# Patient Record
Sex: Female | Born: 1950 | Race: White | Hispanic: No | Marital: Married
Health system: Southern US, Community
[De-identification: ages and names within clinical notes are randomized; demographics above are authoritative.]

---

## 1998-08-09 ENCOUNTER — Ambulatory Visit (HOSPITAL_COMMUNITY): Admission: RE | Admit: 1998-08-09 | Discharge: 1998-08-09 | Payer: Self-pay

## 1999-08-18 ENCOUNTER — Encounter: Payer: Self-pay | Admitting: Family Medicine

## 1999-08-18 ENCOUNTER — Ambulatory Visit (HOSPITAL_COMMUNITY): Admission: RE | Admit: 1999-08-18 | Discharge: 1999-08-18 | Payer: Self-pay | Admitting: Family Medicine

## 2000-08-27 ENCOUNTER — Ambulatory Visit (HOSPITAL_COMMUNITY): Admission: RE | Admit: 2000-08-27 | Discharge: 2000-08-27 | Payer: Self-pay | Admitting: Family Medicine

## 2000-08-27 ENCOUNTER — Encounter: Payer: Self-pay | Admitting: Family Medicine

## 2001-09-20 ENCOUNTER — Ambulatory Visit (HOSPITAL_COMMUNITY): Admission: RE | Admit: 2001-09-20 | Discharge: 2001-09-20 | Payer: Self-pay | Admitting: *Deleted

## 2001-09-20 ENCOUNTER — Encounter: Payer: Self-pay | Admitting: Family Medicine

## 2002-10-04 ENCOUNTER — Ambulatory Visit (HOSPITAL_COMMUNITY): Admission: RE | Admit: 2002-10-04 | Discharge: 2002-10-04 | Payer: Self-pay | Admitting: *Deleted

## 2002-10-04 ENCOUNTER — Encounter: Payer: Self-pay | Admitting: Family Medicine

## 2003-10-08 ENCOUNTER — Ambulatory Visit (HOSPITAL_COMMUNITY): Admission: RE | Admit: 2003-10-08 | Discharge: 2003-10-08 | Payer: Self-pay | Admitting: Family Medicine

## 2004-10-15 ENCOUNTER — Ambulatory Visit (HOSPITAL_COMMUNITY): Admission: RE | Admit: 2004-10-15 | Discharge: 2004-10-15 | Payer: Self-pay | Admitting: Family Medicine

## 2005-10-20 ENCOUNTER — Ambulatory Visit (HOSPITAL_COMMUNITY): Admission: RE | Admit: 2005-10-20 | Discharge: 2005-10-20 | Payer: Self-pay | Admitting: Family Medicine

## 2006-11-03 ENCOUNTER — Ambulatory Visit (HOSPITAL_COMMUNITY): Admission: RE | Admit: 2006-11-03 | Discharge: 2006-11-03 | Payer: Self-pay | Admitting: Family Medicine

## 2007-11-15 ENCOUNTER — Ambulatory Visit (HOSPITAL_COMMUNITY): Admission: RE | Admit: 2007-11-15 | Discharge: 2007-11-15 | Payer: Self-pay | Admitting: Family Medicine

## 2008-11-15 ENCOUNTER — Ambulatory Visit (HOSPITAL_COMMUNITY): Admission: RE | Admit: 2008-11-15 | Discharge: 2008-11-15 | Payer: Self-pay | Admitting: Family Medicine

## 2009-11-19 ENCOUNTER — Ambulatory Visit (HOSPITAL_COMMUNITY): Admission: RE | Admit: 2009-11-19 | Discharge: 2009-11-19 | Payer: Self-pay | Admitting: Family Medicine

## 2010-10-28 ENCOUNTER — Other Ambulatory Visit (HOSPITAL_COMMUNITY): Payer: Self-pay | Admitting: Family Medicine

## 2010-10-28 DIAGNOSIS — Z1231 Encounter for screening mammogram for malignant neoplasm of breast: Secondary | ICD-10-CM

## 2010-11-24 ENCOUNTER — Ambulatory Visit (HOSPITAL_COMMUNITY)
Admission: RE | Admit: 2010-11-24 | Discharge: 2010-11-24 | Disposition: A | Discharge status: Home / Self Care | Payer: BC Managed Care – PPO | Source: Ambulatory Visit | Attending: Family Medicine | Admitting: Family Medicine

## 2010-11-24 DIAGNOSIS — Z1231 Encounter for screening mammogram for malignant neoplasm of breast: Secondary | ICD-10-CM | POA: Insufficient documentation

## 2011-11-03 ENCOUNTER — Other Ambulatory Visit (HOSPITAL_COMMUNITY): Payer: Self-pay | Admitting: Family Medicine

## 2011-11-03 DIAGNOSIS — Z1231 Encounter for screening mammogram for malignant neoplasm of breast: Secondary | ICD-10-CM

## 2011-11-25 ENCOUNTER — Ambulatory Visit (HOSPITAL_COMMUNITY)
Admission: RE | Admit: 2011-11-25 | Discharge: 2011-11-25 | Disposition: A | Discharge status: Home / Self Care | Payer: BC Managed Care – PPO | Source: Ambulatory Visit | Attending: Family Medicine | Admitting: Family Medicine

## 2011-11-25 DIAGNOSIS — Z1231 Encounter for screening mammogram for malignant neoplasm of breast: Secondary | ICD-10-CM

## 2012-11-18 ENCOUNTER — Other Ambulatory Visit (HOSPITAL_COMMUNITY): Payer: Self-pay | Admitting: Family Medicine

## 2012-11-18 DIAGNOSIS — Z1231 Encounter for screening mammogram for malignant neoplasm of breast: Secondary | ICD-10-CM

## 2012-11-30 ENCOUNTER — Ambulatory Visit (HOSPITAL_COMMUNITY)
Admission: RE | Admit: 2012-11-30 | Discharge: 2012-11-30 | Disposition: A | Discharge status: Home / Self Care | Payer: BC Managed Care – PPO | Source: Ambulatory Visit | Attending: Family Medicine | Admitting: Family Medicine

## 2012-11-30 DIAGNOSIS — Z1231 Encounter for screening mammogram for malignant neoplasm of breast: Secondary | ICD-10-CM | POA: Insufficient documentation

## 2013-11-06 ENCOUNTER — Other Ambulatory Visit (HOSPITAL_COMMUNITY): Payer: Self-pay | Admitting: Family Medicine

## 2013-11-06 DIAGNOSIS — Z1231 Encounter for screening mammogram for malignant neoplasm of breast: Secondary | ICD-10-CM

## 2013-12-05 ENCOUNTER — Other Ambulatory Visit (HOSPITAL_COMMUNITY): Payer: Self-pay | Admitting: Family Medicine

## 2013-12-05 ENCOUNTER — Ambulatory Visit (HOSPITAL_COMMUNITY)
Admission: RE | Admit: 2013-12-05 | Discharge: 2013-12-05 | Disposition: A | Discharge status: Home / Self Care | Payer: BC Managed Care – PPO | Source: Ambulatory Visit | Attending: Family Medicine | Admitting: Family Medicine

## 2013-12-05 DIAGNOSIS — Z1231 Encounter for screening mammogram for malignant neoplasm of breast: Secondary | ICD-10-CM

## 2019-06-14 ENCOUNTER — Other Ambulatory Visit: Payer: Self-pay | Admitting: Family Medicine

## 2019-06-14 DIAGNOSIS — N6489 Other specified disorders of breast: Secondary | ICD-10-CM

## 2019-06-14 DIAGNOSIS — R928 Other abnormal and inconclusive findings on diagnostic imaging of breast: Secondary | ICD-10-CM | POA: Diagnosis not present

## 2019-06-20 ENCOUNTER — Other Ambulatory Visit: Payer: Self-pay | Admitting: Family Medicine

## 2019-06-20 DIAGNOSIS — N6489 Other specified disorders of breast: Secondary | ICD-10-CM

## 2019-06-21 ENCOUNTER — Ambulatory Visit
Admission: RE | Admit: 2019-06-21 | Discharge: 2019-06-21 | Disposition: A | Discharge status: Home / Self Care | Payer: Medicare PPO | Source: Ambulatory Visit | Attending: Family Medicine | Admitting: Family Medicine

## 2019-06-21 ENCOUNTER — Other Ambulatory Visit: Payer: Self-pay

## 2019-06-21 DIAGNOSIS — N6489 Other specified disorders of breast: Secondary | ICD-10-CM

## 2019-06-21 DIAGNOSIS — C50811 Malignant neoplasm of overlapping sites of right female breast: Secondary | ICD-10-CM | POA: Diagnosis not present

## 2019-06-21 DIAGNOSIS — R928 Other abnormal and inconclusive findings on diagnostic imaging of breast: Secondary | ICD-10-CM | POA: Diagnosis not present

## 2019-06-26 ENCOUNTER — Other Ambulatory Visit: Payer: Self-pay | Admitting: Family Medicine

## 2019-06-26 DIAGNOSIS — N6489 Other specified disorders of breast: Secondary | ICD-10-CM

## 2019-06-29 ENCOUNTER — Ambulatory Visit
Admission: RE | Admit: 2019-06-29 | Discharge: 2019-06-29 | Disposition: A | Discharge status: Home / Self Care | Payer: Medicare PPO | Source: Ambulatory Visit | Attending: Family Medicine | Admitting: Family Medicine

## 2019-06-29 ENCOUNTER — Other Ambulatory Visit: Payer: Self-pay

## 2019-06-29 DIAGNOSIS — N6489 Other specified disorders of breast: Secondary | ICD-10-CM

## 2019-06-29 DIAGNOSIS — Z170 Estrogen receptor positive status [ER+]: Secondary | ICD-10-CM | POA: Diagnosis not present

## 2019-06-29 DIAGNOSIS — R928 Other abnormal and inconclusive findings on diagnostic imaging of breast: Secondary | ICD-10-CM | POA: Diagnosis not present

## 2019-06-29 DIAGNOSIS — C50411 Malignant neoplasm of upper-outer quadrant of right female breast: Secondary | ICD-10-CM | POA: Diagnosis not present

## 2019-07-04 DIAGNOSIS — C50511 Malignant neoplasm of lower-outer quadrant of right female breast: Secondary | ICD-10-CM | POA: Diagnosis not present

## 2019-07-04 DIAGNOSIS — Z01818 Encounter for other preprocedural examination: Secondary | ICD-10-CM | POA: Diagnosis not present

## 2019-07-04 DIAGNOSIS — Z170 Estrogen receptor positive status [ER+]: Secondary | ICD-10-CM | POA: Diagnosis not present

## 2019-07-13 DIAGNOSIS — Z01810 Encounter for preprocedural cardiovascular examination: Secondary | ICD-10-CM | POA: Diagnosis not present

## 2019-07-13 DIAGNOSIS — C50411 Malignant neoplasm of upper-outer quadrant of right female breast: Secondary | ICD-10-CM | POA: Diagnosis not present

## 2019-07-13 DIAGNOSIS — C50511 Malignant neoplasm of lower-outer quadrant of right female breast: Secondary | ICD-10-CM | POA: Diagnosis not present

## 2019-07-13 DIAGNOSIS — I517 Cardiomegaly: Secondary | ICD-10-CM | POA: Diagnosis not present

## 2019-07-13 DIAGNOSIS — C50911 Malignant neoplasm of unspecified site of right female breast: Secondary | ICD-10-CM | POA: Diagnosis not present

## 2019-07-13 DIAGNOSIS — Z170 Estrogen receptor positive status [ER+]: Secondary | ICD-10-CM | POA: Diagnosis not present

## 2019-07-13 DIAGNOSIS — Z01818 Encounter for other preprocedural examination: Secondary | ICD-10-CM | POA: Diagnosis not present

## 2019-07-20 DIAGNOSIS — Z20822 Contact with and (suspected) exposure to covid-19: Secondary | ICD-10-CM | POA: Diagnosis not present

## 2019-07-24 DIAGNOSIS — Z170 Estrogen receptor positive status [ER+]: Secondary | ICD-10-CM | POA: Diagnosis not present

## 2019-07-24 DIAGNOSIS — K219 Gastro-esophageal reflux disease without esophagitis: Secondary | ICD-10-CM | POA: Diagnosis not present

## 2019-07-24 DIAGNOSIS — I10 Essential (primary) hypertension: Secondary | ICD-10-CM | POA: Diagnosis not present

## 2019-07-24 DIAGNOSIS — C50511 Malignant neoplasm of lower-outer quadrant of right female breast: Secondary | ICD-10-CM | POA: Diagnosis not present

## 2019-07-24 DIAGNOSIS — Z803 Family history of malignant neoplasm of breast: Secondary | ICD-10-CM | POA: Diagnosis not present

## 2019-07-24 DIAGNOSIS — C50911 Malignant neoplasm of unspecified site of right female breast: Secondary | ICD-10-CM | POA: Diagnosis not present

## 2019-08-01 DIAGNOSIS — Z01818 Encounter for other preprocedural examination: Secondary | ICD-10-CM | POA: Diagnosis not present

## 2019-08-01 DIAGNOSIS — Z170 Estrogen receptor positive status [ER+]: Secondary | ICD-10-CM | POA: Diagnosis not present

## 2019-08-01 DIAGNOSIS — C50311 Malignant neoplasm of lower-inner quadrant of right female breast: Secondary | ICD-10-CM | POA: Diagnosis not present

## 2019-08-22 DIAGNOSIS — C50311 Malignant neoplasm of lower-inner quadrant of right female breast: Secondary | ICD-10-CM | POA: Diagnosis not present

## 2019-08-22 DIAGNOSIS — C50911 Malignant neoplasm of unspecified site of right female breast: Secondary | ICD-10-CM | POA: Diagnosis not present

## 2019-08-22 DIAGNOSIS — Z170 Estrogen receptor positive status [ER+]: Secondary | ICD-10-CM | POA: Diagnosis not present

## 2019-08-31 DIAGNOSIS — E2839 Other primary ovarian failure: Secondary | ICD-10-CM | POA: Diagnosis not present

## 2019-08-31 DIAGNOSIS — C50911 Malignant neoplasm of unspecified site of right female breast: Secondary | ICD-10-CM | POA: Diagnosis not present

## 2019-08-31 DIAGNOSIS — Z170 Estrogen receptor positive status [ER+]: Secondary | ICD-10-CM | POA: Diagnosis not present

## 2019-08-31 DIAGNOSIS — C50811 Malignant neoplasm of overlapping sites of right female breast: Secondary | ICD-10-CM | POA: Diagnosis not present

## 2019-09-08 DIAGNOSIS — Z170 Estrogen receptor positive status [ER+]: Secondary | ICD-10-CM | POA: Diagnosis not present

## 2019-09-08 DIAGNOSIS — N641 Fat necrosis of breast: Secondary | ICD-10-CM | POA: Diagnosis not present

## 2019-09-08 DIAGNOSIS — C50511 Malignant neoplasm of lower-outer quadrant of right female breast: Secondary | ICD-10-CM | POA: Diagnosis not present

## 2019-09-14 DIAGNOSIS — C50811 Malignant neoplasm of overlapping sites of right female breast: Secondary | ICD-10-CM | POA: Diagnosis not present

## 2019-09-14 DIAGNOSIS — C50511 Malignant neoplasm of lower-outer quadrant of right female breast: Secondary | ICD-10-CM | POA: Diagnosis not present

## 2019-09-19 DIAGNOSIS — C50511 Malignant neoplasm of lower-outer quadrant of right female breast: Secondary | ICD-10-CM | POA: Diagnosis not present

## 2019-09-22 DIAGNOSIS — Z6841 Body Mass Index (BMI) 40.0 and over, adult: Secondary | ICD-10-CM | POA: Diagnosis not present

## 2019-09-22 DIAGNOSIS — K921 Melena: Secondary | ICD-10-CM | POA: Diagnosis not present

## 2019-09-22 DIAGNOSIS — R1013 Epigastric pain: Secondary | ICD-10-CM | POA: Diagnosis not present

## 2019-09-25 DIAGNOSIS — C50511 Malignant neoplasm of lower-outer quadrant of right female breast: Secondary | ICD-10-CM | POA: Diagnosis not present

## 2019-09-26 DIAGNOSIS — C50511 Malignant neoplasm of lower-outer quadrant of right female breast: Secondary | ICD-10-CM | POA: Diagnosis not present

## 2019-09-27 DIAGNOSIS — C50511 Malignant neoplasm of lower-outer quadrant of right female breast: Secondary | ICD-10-CM | POA: Diagnosis not present

## 2019-09-28 DIAGNOSIS — C50511 Malignant neoplasm of lower-outer quadrant of right female breast: Secondary | ICD-10-CM | POA: Diagnosis not present

## 2019-09-29 DIAGNOSIS — C50511 Malignant neoplasm of lower-outer quadrant of right female breast: Secondary | ICD-10-CM | POA: Diagnosis not present

## 2019-09-29 DIAGNOSIS — C50811 Malignant neoplasm of overlapping sites of right female breast: Secondary | ICD-10-CM | POA: Diagnosis not present

## 2019-09-30 DIAGNOSIS — C50511 Malignant neoplasm of lower-outer quadrant of right female breast: Secondary | ICD-10-CM | POA: Diagnosis not present

## 2019-10-02 DIAGNOSIS — C50511 Malignant neoplasm of lower-outer quadrant of right female breast: Secondary | ICD-10-CM | POA: Diagnosis not present

## 2019-10-03 DIAGNOSIS — C50511 Malignant neoplasm of lower-outer quadrant of right female breast: Secondary | ICD-10-CM | POA: Diagnosis not present

## 2019-10-04 DIAGNOSIS — C50511 Malignant neoplasm of lower-outer quadrant of right female breast: Secondary | ICD-10-CM | POA: Diagnosis not present

## 2019-10-05 DIAGNOSIS — G4733 Obstructive sleep apnea (adult) (pediatric): Secondary | ICD-10-CM | POA: Diagnosis not present

## 2019-10-05 DIAGNOSIS — C50511 Malignant neoplasm of lower-outer quadrant of right female breast: Secondary | ICD-10-CM | POA: Diagnosis not present

## 2019-10-06 DIAGNOSIS — C50511 Malignant neoplasm of lower-outer quadrant of right female breast: Secondary | ICD-10-CM | POA: Diagnosis not present

## 2019-10-08 DIAGNOSIS — C50511 Malignant neoplasm of lower-outer quadrant of right female breast: Secondary | ICD-10-CM | POA: Diagnosis not present

## 2019-10-09 DIAGNOSIS — C50511 Malignant neoplasm of lower-outer quadrant of right female breast: Secondary | ICD-10-CM | POA: Diagnosis not present

## 2019-10-10 DIAGNOSIS — C50511 Malignant neoplasm of lower-outer quadrant of right female breast: Secondary | ICD-10-CM | POA: Diagnosis not present

## 2019-10-11 DIAGNOSIS — C50511 Malignant neoplasm of lower-outer quadrant of right female breast: Secondary | ICD-10-CM | POA: Diagnosis not present

## 2019-10-12 DIAGNOSIS — C50511 Malignant neoplasm of lower-outer quadrant of right female breast: Secondary | ICD-10-CM | POA: Diagnosis not present

## 2019-10-13 DIAGNOSIS — C50511 Malignant neoplasm of lower-outer quadrant of right female breast: Secondary | ICD-10-CM | POA: Diagnosis not present

## 2019-10-16 DIAGNOSIS — C50511 Malignant neoplasm of lower-outer quadrant of right female breast: Secondary | ICD-10-CM | POA: Diagnosis not present

## 2019-10-17 DIAGNOSIS — C50511 Malignant neoplasm of lower-outer quadrant of right female breast: Secondary | ICD-10-CM | POA: Diagnosis not present

## 2019-10-18 DIAGNOSIS — C50511 Malignant neoplasm of lower-outer quadrant of right female breast: Secondary | ICD-10-CM | POA: Diagnosis not present

## 2019-10-19 DIAGNOSIS — C50511 Malignant neoplasm of lower-outer quadrant of right female breast: Secondary | ICD-10-CM | POA: Diagnosis not present

## 2019-10-20 DIAGNOSIS — C50511 Malignant neoplasm of lower-outer quadrant of right female breast: Secondary | ICD-10-CM | POA: Diagnosis not present

## 2019-10-22 DIAGNOSIS — C50511 Malignant neoplasm of lower-outer quadrant of right female breast: Secondary | ICD-10-CM | POA: Diagnosis not present

## 2019-10-23 DIAGNOSIS — C50511 Malignant neoplasm of lower-outer quadrant of right female breast: Secondary | ICD-10-CM | POA: Diagnosis not present

## 2019-10-24 DIAGNOSIS — C50511 Malignant neoplasm of lower-outer quadrant of right female breast: Secondary | ICD-10-CM | POA: Diagnosis not present

## 2019-12-04 DIAGNOSIS — G4733 Obstructive sleep apnea (adult) (pediatric): Secondary | ICD-10-CM | POA: Diagnosis not present

## 2020-01-17 DIAGNOSIS — Z8616 Personal history of COVID-19: Secondary | ICD-10-CM | POA: Diagnosis not present

## 2020-01-17 DIAGNOSIS — J0120 Acute ethmoidal sinusitis, unspecified: Secondary | ICD-10-CM | POA: Diagnosis not present

## 2020-01-22 DIAGNOSIS — Z79899 Other long term (current) drug therapy: Secondary | ICD-10-CM | POA: Diagnosis not present

## 2020-01-22 DIAGNOSIS — Z23 Encounter for immunization: Secondary | ICD-10-CM | POA: Diagnosis not present

## 2020-01-31 DIAGNOSIS — G4733 Obstructive sleep apnea (adult) (pediatric): Secondary | ICD-10-CM | POA: Diagnosis not present

## 2020-02-13 DIAGNOSIS — R5383 Other fatigue: Secondary | ICD-10-CM | POA: Diagnosis not present

## 2020-02-13 DIAGNOSIS — Z79811 Long term (current) use of aromatase inhibitors: Secondary | ICD-10-CM | POA: Diagnosis not present

## 2020-02-13 DIAGNOSIS — Z79899 Other long term (current) drug therapy: Secondary | ICD-10-CM | POA: Diagnosis not present

## 2020-02-13 DIAGNOSIS — I8393 Asymptomatic varicose veins of bilateral lower extremities: Secondary | ICD-10-CM | POA: Diagnosis not present

## 2020-02-13 DIAGNOSIS — E2839 Other primary ovarian failure: Secondary | ICD-10-CM | POA: Diagnosis not present

## 2020-02-13 DIAGNOSIS — Z170 Estrogen receptor positive status [ER+]: Secondary | ICD-10-CM | POA: Diagnosis not present

## 2020-02-13 DIAGNOSIS — C50811 Malignant neoplasm of overlapping sites of right female breast: Secondary | ICD-10-CM | POA: Diagnosis not present

## 2020-02-22 DIAGNOSIS — E2839 Other primary ovarian failure: Secondary | ICD-10-CM | POA: Diagnosis not present

## 2020-02-22 DIAGNOSIS — Z780 Asymptomatic menopausal state: Secondary | ICD-10-CM | POA: Diagnosis not present

## 2020-03-12 DIAGNOSIS — Z853 Personal history of malignant neoplasm of breast: Secondary | ICD-10-CM | POA: Diagnosis not present

## 2020-03-12 DIAGNOSIS — R922 Inconclusive mammogram: Secondary | ICD-10-CM | POA: Diagnosis not present

## 2020-03-28 DIAGNOSIS — Z08 Encounter for follow-up examination after completed treatment for malignant neoplasm: Secondary | ICD-10-CM | POA: Diagnosis not present

## 2020-03-28 DIAGNOSIS — Z853 Personal history of malignant neoplasm of breast: Secondary | ICD-10-CM | POA: Diagnosis not present

## 2020-04-30 DIAGNOSIS — H2513 Age-related nuclear cataract, bilateral: Secondary | ICD-10-CM | POA: Diagnosis not present

## 2020-05-07 DIAGNOSIS — Z6841 Body Mass Index (BMI) 40.0 and over, adult: Secondary | ICD-10-CM | POA: Diagnosis not present

## 2020-05-07 DIAGNOSIS — M2550 Pain in unspecified joint: Secondary | ICD-10-CM | POA: Diagnosis not present

## 2020-05-07 DIAGNOSIS — K219 Gastro-esophageal reflux disease without esophagitis: Secondary | ICD-10-CM | POA: Diagnosis not present

## 2020-05-07 DIAGNOSIS — K279 Peptic ulcer, site unspecified, unspecified as acute or chronic, without hemorrhage or perforation: Secondary | ICD-10-CM | POA: Diagnosis not present

## 2020-05-07 DIAGNOSIS — F419 Anxiety disorder, unspecified: Secondary | ICD-10-CM | POA: Diagnosis not present

## 2020-05-07 DIAGNOSIS — G4733 Obstructive sleep apnea (adult) (pediatric): Secondary | ICD-10-CM | POA: Diagnosis not present

## 2020-05-07 DIAGNOSIS — Z1331 Encounter for screening for depression: Secondary | ICD-10-CM | POA: Diagnosis not present

## 2020-05-07 DIAGNOSIS — Z0000 Encounter for general adult medical examination without abnormal findings: Secondary | ICD-10-CM | POA: Diagnosis not present

## 2020-05-14 DIAGNOSIS — G4733 Obstructive sleep apnea (adult) (pediatric): Secondary | ICD-10-CM | POA: Diagnosis not present

## 2020-05-15 DIAGNOSIS — Z170 Estrogen receptor positive status [ER+]: Secondary | ICD-10-CM | POA: Diagnosis not present

## 2020-05-15 DIAGNOSIS — C50811 Malignant neoplasm of overlapping sites of right female breast: Secondary | ICD-10-CM | POA: Diagnosis not present

## 2020-05-28 DIAGNOSIS — L821 Other seborrheic keratosis: Secondary | ICD-10-CM | POA: Diagnosis not present

## 2020-05-28 DIAGNOSIS — D485 Neoplasm of uncertain behavior of skin: Secondary | ICD-10-CM | POA: Diagnosis not present

## 2020-05-28 DIAGNOSIS — L820 Inflamed seborrheic keratosis: Secondary | ICD-10-CM | POA: Diagnosis not present

## 2020-07-27 DIAGNOSIS — B021 Zoster meningitis: Secondary | ICD-10-CM | POA: Diagnosis not present

## 2020-08-02 DIAGNOSIS — Z6841 Body Mass Index (BMI) 40.0 and over, adult: Secondary | ICD-10-CM | POA: Diagnosis not present

## 2020-08-02 DIAGNOSIS — H6091 Unspecified otitis externa, right ear: Secondary | ICD-10-CM | POA: Diagnosis not present

## 2020-08-02 DIAGNOSIS — B0222 Postherpetic trigeminal neuralgia: Secondary | ICD-10-CM | POA: Diagnosis not present

## 2020-08-23 DIAGNOSIS — F419 Anxiety disorder, unspecified: Secondary | ICD-10-CM | POA: Diagnosis not present

## 2020-08-23 DIAGNOSIS — B0222 Postherpetic trigeminal neuralgia: Secondary | ICD-10-CM | POA: Diagnosis not present

## 2020-08-23 DIAGNOSIS — Z6841 Body Mass Index (BMI) 40.0 and over, adult: Secondary | ICD-10-CM | POA: Diagnosis not present

## 2020-09-18 DIAGNOSIS — G4733 Obstructive sleep apnea (adult) (pediatric): Secondary | ICD-10-CM | POA: Diagnosis not present

## 2020-11-12 DIAGNOSIS — Z170 Estrogen receptor positive status [ER+]: Secondary | ICD-10-CM | POA: Diagnosis not present

## 2020-11-12 DIAGNOSIS — R5383 Other fatigue: Secondary | ICD-10-CM | POA: Diagnosis not present

## 2020-11-12 DIAGNOSIS — E2839 Other primary ovarian failure: Secondary | ICD-10-CM | POA: Diagnosis not present

## 2020-11-12 DIAGNOSIS — C50811 Malignant neoplasm of overlapping sites of right female breast: Secondary | ICD-10-CM | POA: Diagnosis not present

## 2020-11-26 DIAGNOSIS — C50811 Malignant neoplasm of overlapping sites of right female breast: Secondary | ICD-10-CM | POA: Diagnosis not present

## 2020-11-26 DIAGNOSIS — Z170 Estrogen receptor positive status [ER+]: Secondary | ICD-10-CM | POA: Diagnosis not present

## 2020-11-26 DIAGNOSIS — C50511 Malignant neoplasm of lower-outer quadrant of right female breast: Secondary | ICD-10-CM | POA: Diagnosis not present

## 2021-01-14 DIAGNOSIS — G4733 Obstructive sleep apnea (adult) (pediatric): Secondary | ICD-10-CM | POA: Diagnosis not present

## 2021-02-12 DIAGNOSIS — Z23 Encounter for immunization: Secondary | ICD-10-CM | POA: Diagnosis not present

## 2021-04-01 DIAGNOSIS — R922 Inconclusive mammogram: Secondary | ICD-10-CM | POA: Diagnosis not present

## 2021-04-01 DIAGNOSIS — Z853 Personal history of malignant neoplasm of breast: Secondary | ICD-10-CM | POA: Diagnosis not present

## 2021-04-03 DIAGNOSIS — Z853 Personal history of malignant neoplasm of breast: Secondary | ICD-10-CM | POA: Diagnosis not present

## 2021-04-03 DIAGNOSIS — Z08 Encounter for follow-up examination after completed treatment for malignant neoplasm: Secondary | ICD-10-CM | POA: Diagnosis not present

## 2021-04-25 IMAGING — MG MM BREAST LOCALIZATION CLIP
4 series · 4 of 12 positions shown · non-contrast
Comparison: Previous exam(s).
COMPARISON: Previous exam(s).

Addendum:
CLINICAL DATA: Patient status post stereotactic guided core needle
biopsy right breast asymmetry.

EXAM:
DIAGNOSTIC RIGHT MAMMOGRAM POST STEREOTACTIC BIOPSY

[R CC synth-2D]
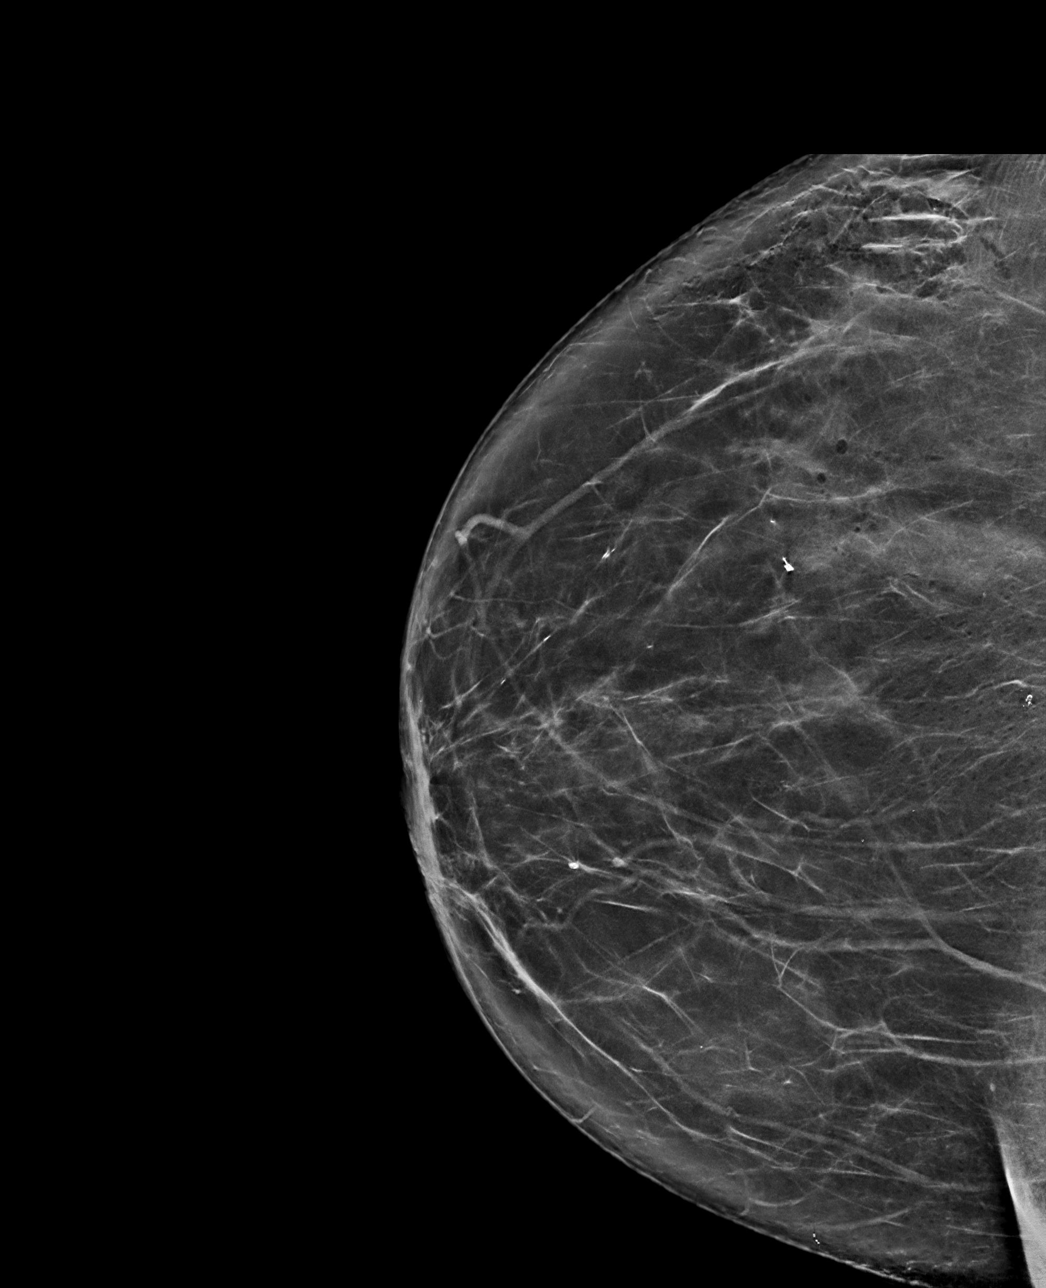

[R LM synth-2D]
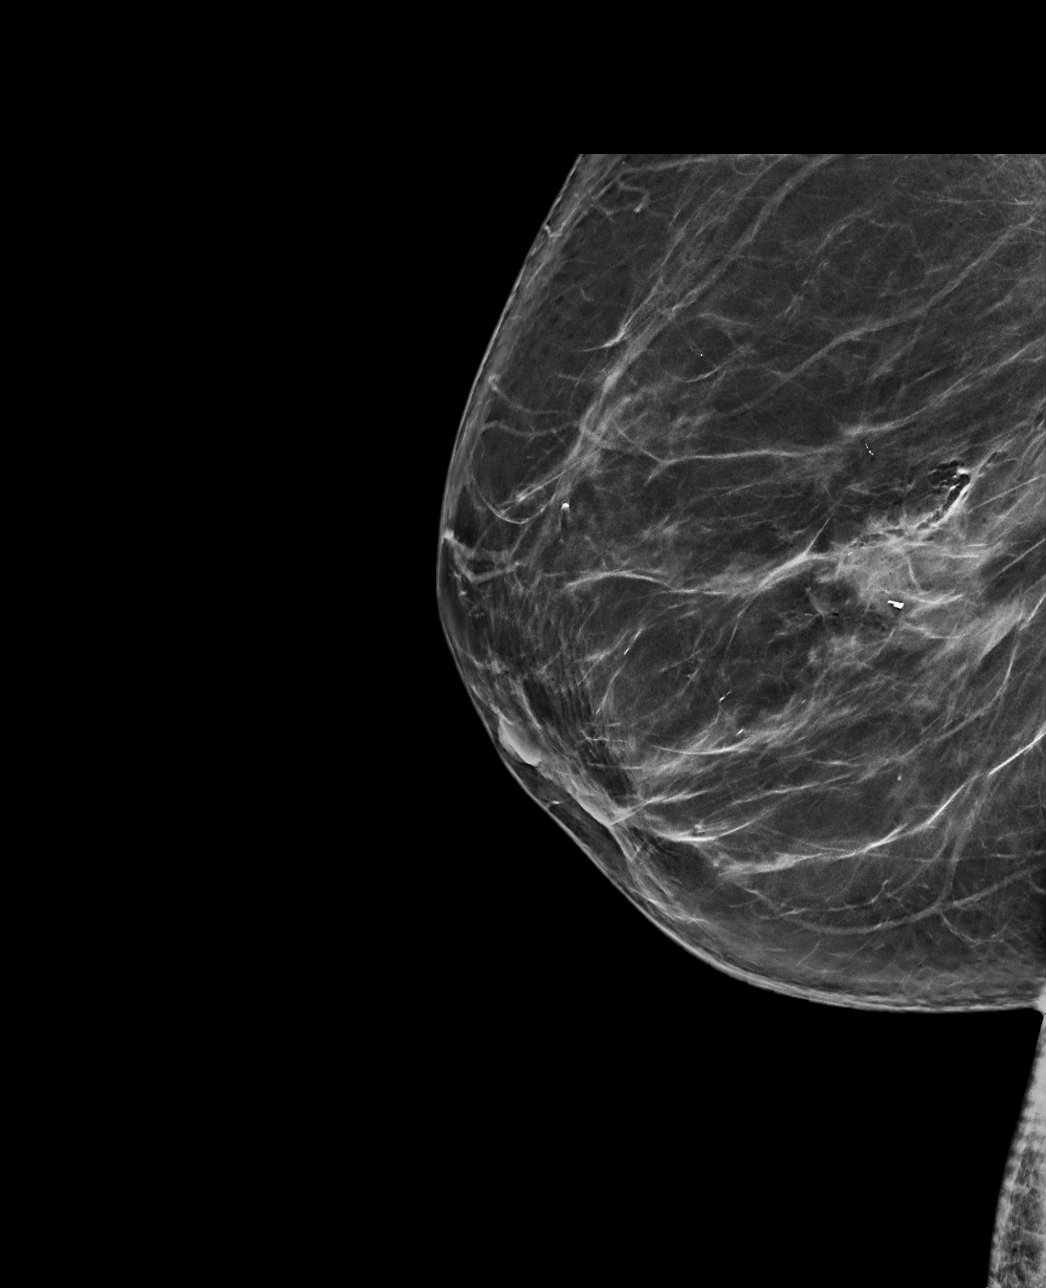

[R CC tomo · tomo slice 43/86.0]
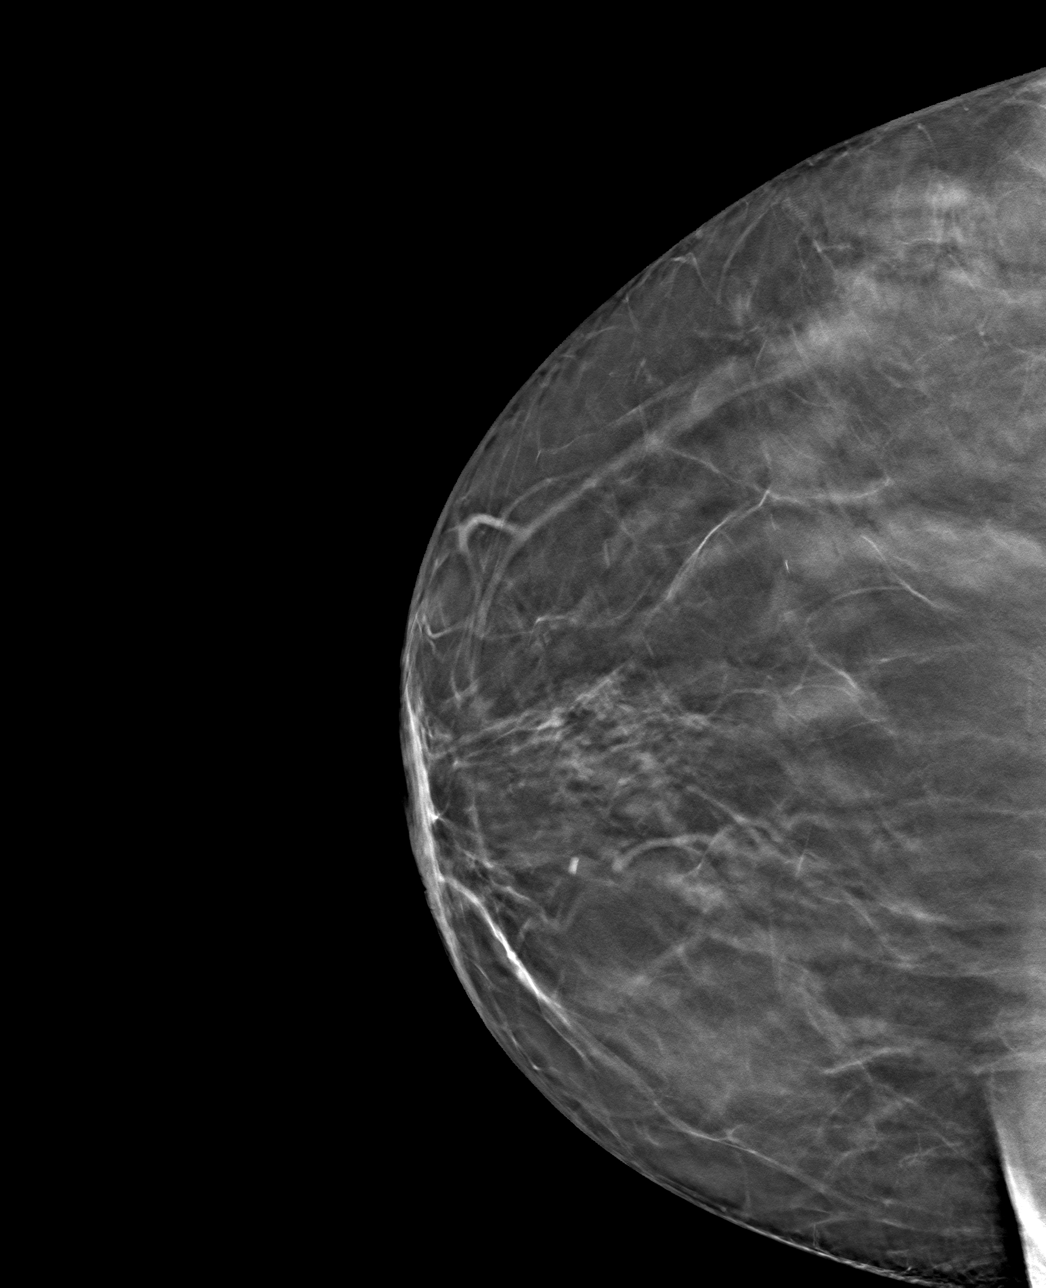

[R LM tomo · tomo slice 47/94.0]
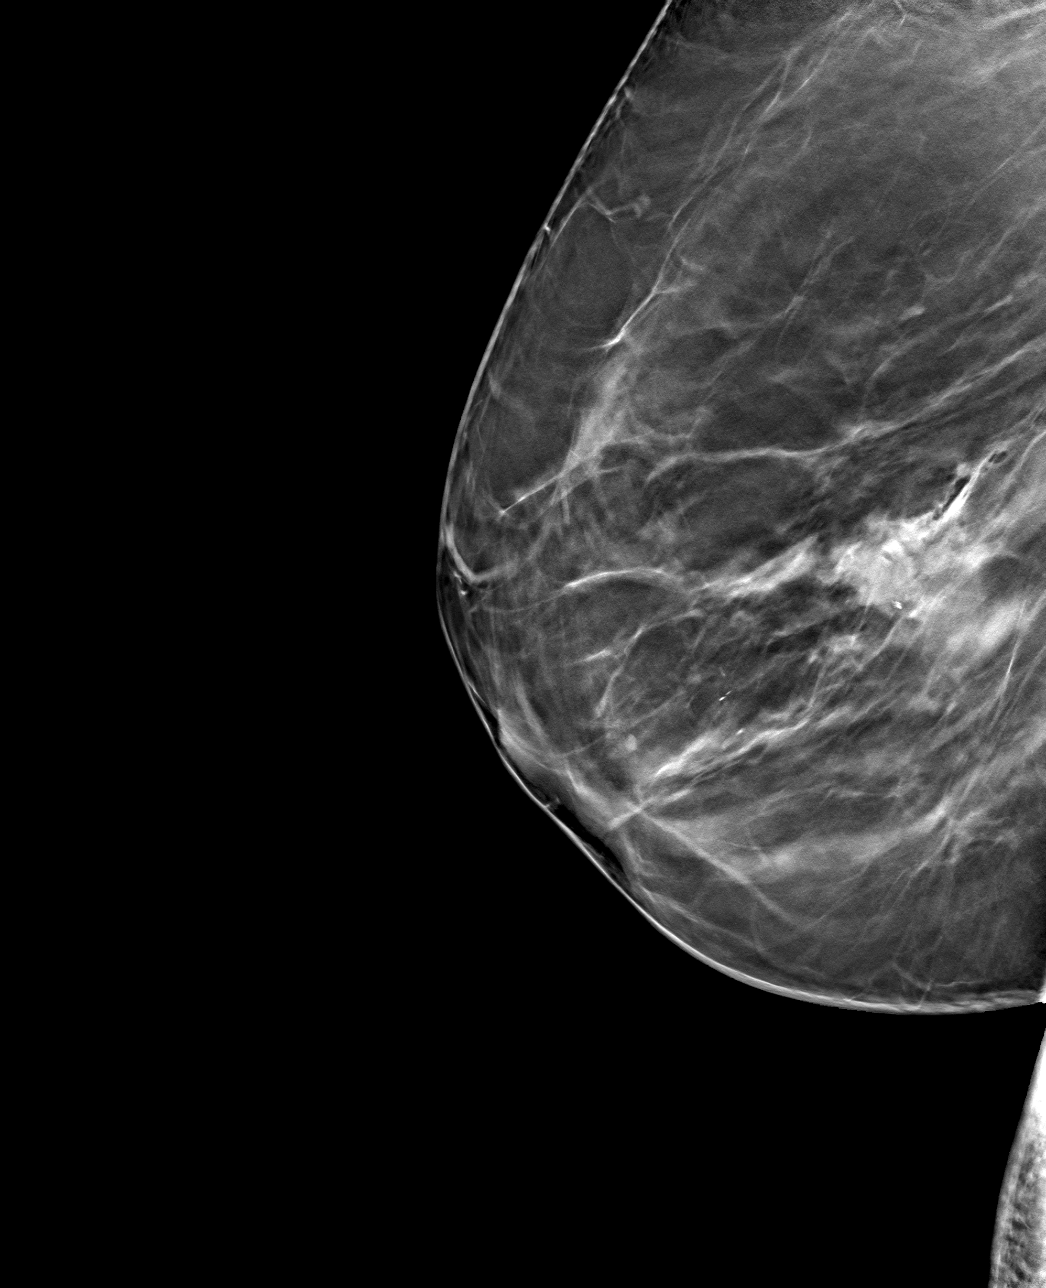

[4 of 12 positions shown; findings below may reference images not displayed]

FINDINGS: Mammographic images were obtained following stereotactic guided
biopsy of right breast asymmetry. The biopsy marking clip is in
expected position at the site of biopsy.
IMPRESSION: Appropriate positioning of the coil shaped biopsy marking clip at
the site of biopsy in the right breast.

Final Assessment: Post Procedure Mammograms for Marker Placement

ADDENDUM:
Additionally, upon further review of the diagnostic imaging
06/14/2019 as well as post clip images 06/21/2019, there is
suggestion of an asymmetry within the central aspect of the right
breast which is demonstrated best on the CC view. The coil shaped
marking clip is located approximately 2.5 cm lateral to this
asymmetry identified on the CC view. It is likely that the area
biopsied on 06/21/2019 (on the ML view) does not correspond with
this additional asymmetry identified on the CC view and therefore an
additional stereotactic (3D guided) biopsy of this asymmetry is
recommended prior to surgery for the recently diagnosed area of
right breast carcinoma.

*** End of Addendum ***
FINDINGS: Mammographic images were obtained following stereotactic guided
biopsy of right breast asymmetry. The biopsy marking clip is in
expected position at the site of biopsy.
IMPRESSION: Appropriate positioning of the coil shaped biopsy marking clip at
the site of biopsy in the right breast.

Final Assessment: Post Procedure Mammograms for Marker Placement

## 2021-04-25 IMAGING — MG MM BREAST BX W/ LOC DEV 1ST LESION IMAGE BX SPEC STEREO GUIDE*R*
8 of 9 series · 8 of 17 positions shown · non-contrast
Comparison: Previous exams.
COMPARISON: Previous exams.

Addendum:
CLINICAL DATA: Patient with indeterminate right breast asymmetry.

EXAM:
RIGHT BREAST STEREOTACTIC CORE NEEDLE BIOPSY

[R (1 of 6)]
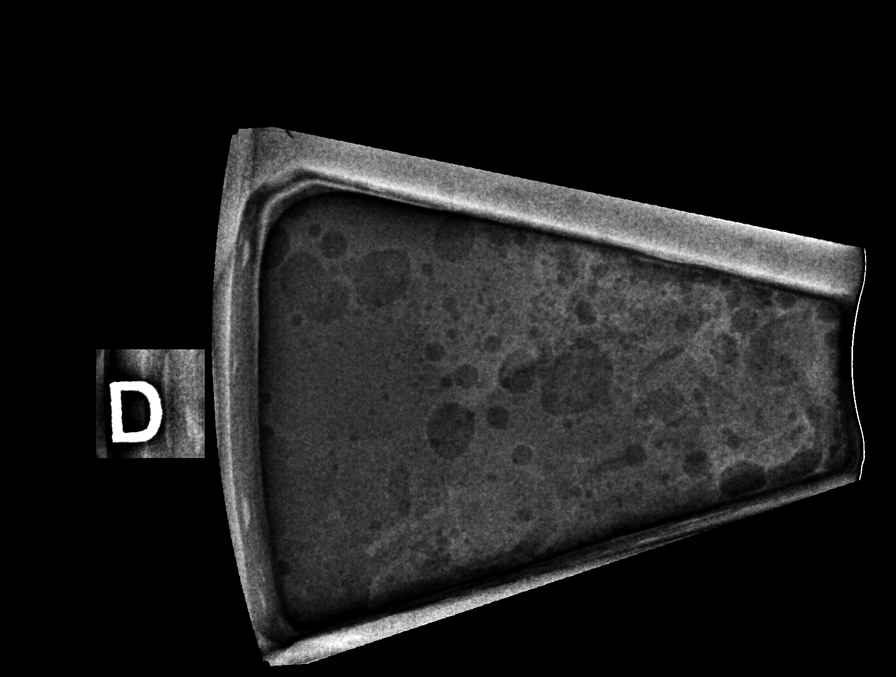

[R (2 of 6)]
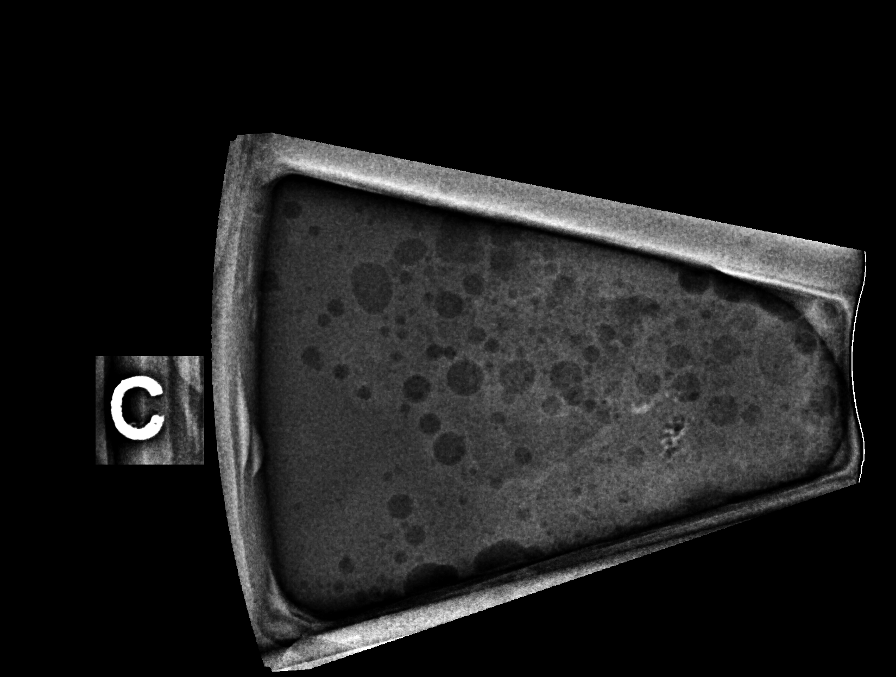

[R (3 of 6)]
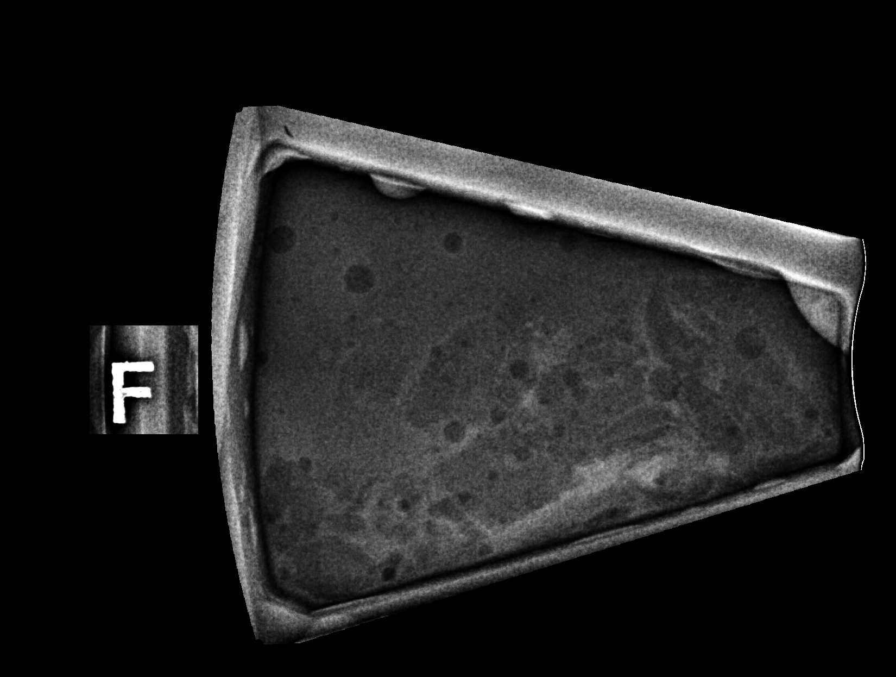

[R (4 of 6)]
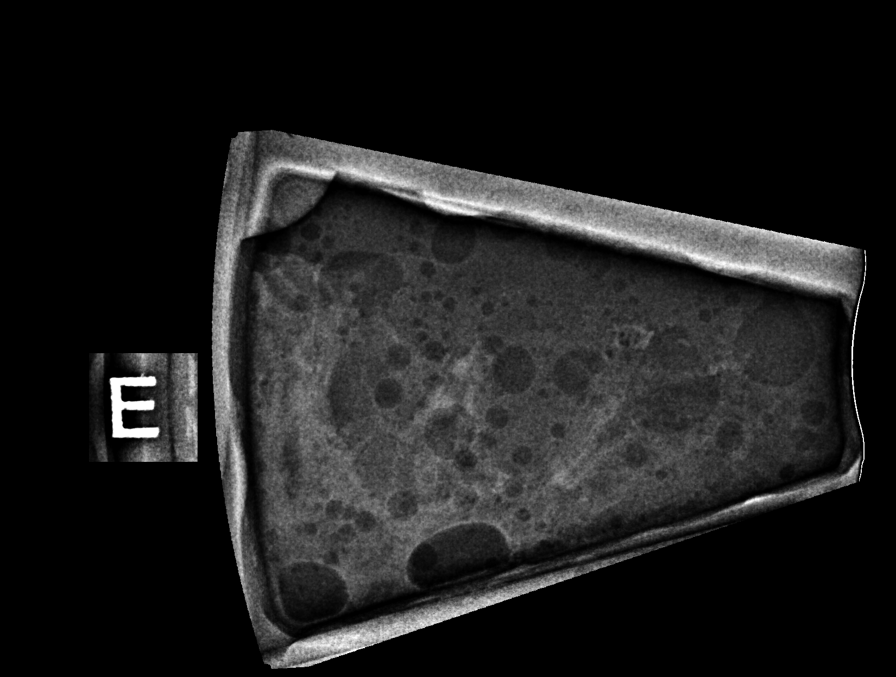

[R (5 of 6)]
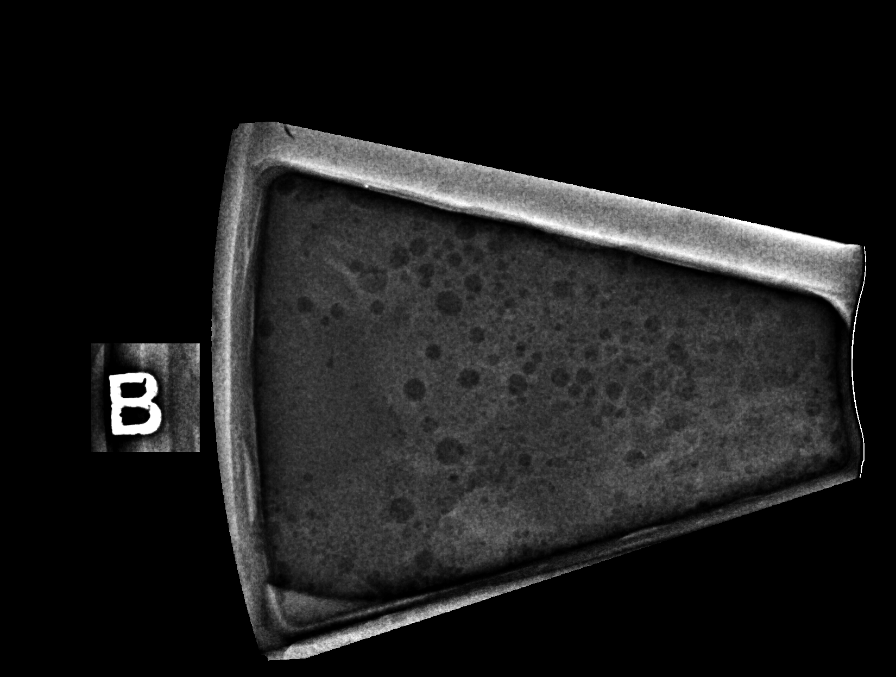

[R (6 of 6)]
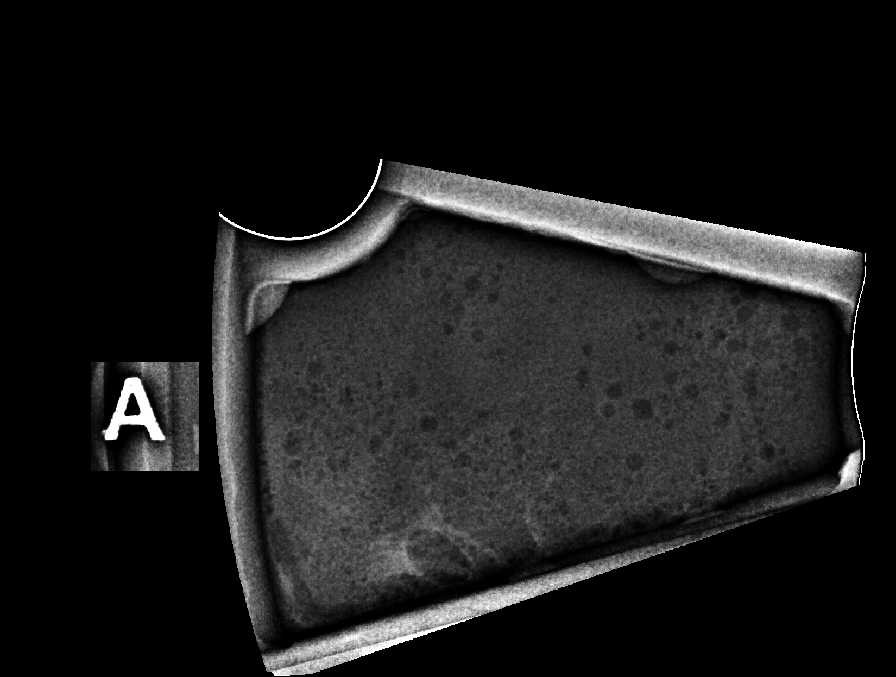

[R LM]
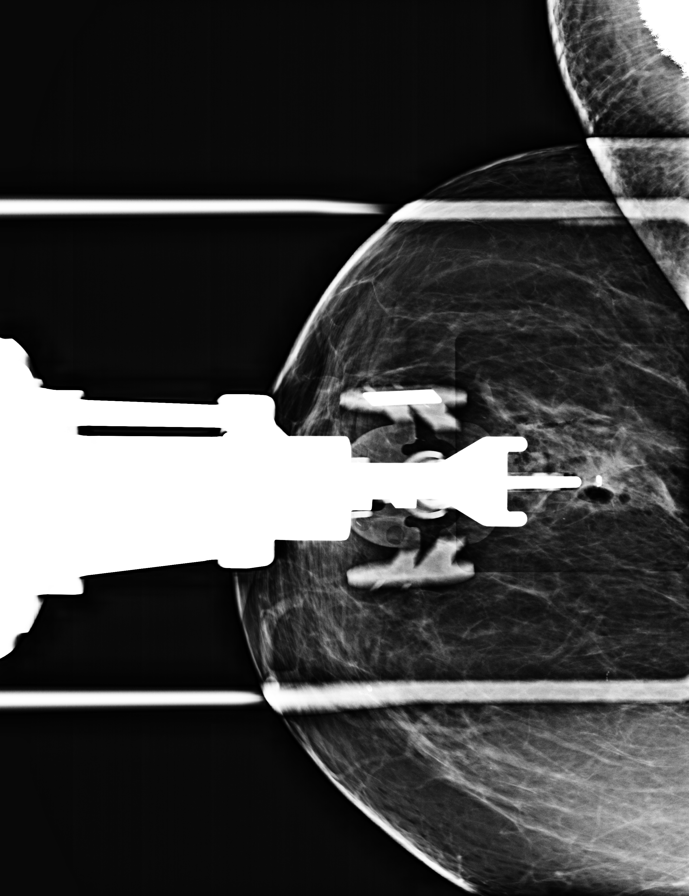

[R LM tomo · tomo slice 39/78.0]
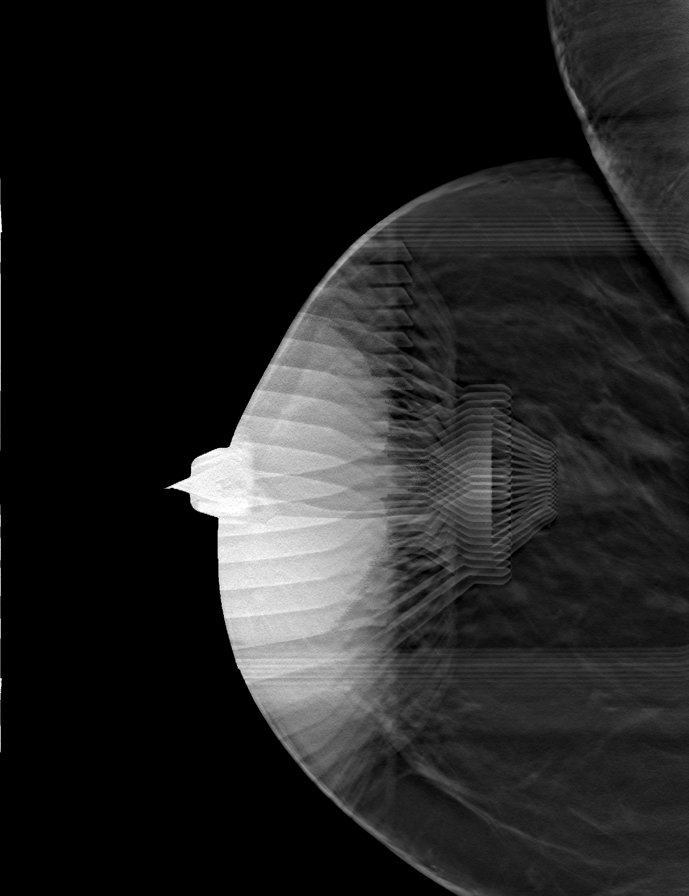

[8 of 17 positions shown; findings below may reference images not displayed]



Using sterile technique and 1% Lidocaine as local anesthetic, under
stereotactic guidance, a 9 gauge vacuum assisted device was used to
perform core needle biopsy of asymmetry within the inferior right
breast using a lateral approach.

Lesion quadrant: Lower outer quadrant

At the conclusion of the procedure, coil shaped tissue marker clip
was deployed into the biopsy cavity. Follow-up 2-view mammogram was
performed and dictated separately.
IMPRESSION: Stereotactic-guided biopsy of right breast asymmetry. No apparent
complications.

ADDENDUM:
Pathology revealed GRADE I INVASIVE MAMMARY CARCINOMA, MAMMARY
CARCINOMA IN SITU of the Right breast, inferior. This was found to
be concordant by Dr. Ateliers Besancon.

Pathology results were called to Dr. Kaylene Cova at [REDACTED] in [HOSPITAL][HOSPITAL] on June 22, 2019.

Dr. Paulus N Ceejay notify the patient of results and arrange a surgical
referral, per request.

Imaging and pathology results were faxed to Zulaika Labo at Dr. [REDACTED] on June 22, 2019.

Pathology results reported by Taleski, RN on 06/22/2019.

Additionally, upon further review of the diagnostic imaging
06/14/2019 as well as post clip images 06/21/2019, there is
suggestion of an asymmetry within the central aspect of the right
breast which is demonstrated best on the CC view. The coil shaped
marking clip is located approximately 2.5 cm lateral to this
asymmetry on the cc view. It is likely that the area biopsied on
06/21/2019 does not correspond with this additional asymmetry
identified on the CC view and therefore an additional stereotactic
(3D guided) biopsy of this asymmetry is recommended prior to surgery
for the recently diagnosed area of right breast carcinoma.

This was discussed with the patient on 06/26/2019 at [DATE] a.m by
Dr. Quirijn.

The patient is scheduled for second stereotactic guided core needle
biopsy on 06/29/2019 at [DATE] a.m.



Using sterile technique and 1% Lidocaine as local anesthetic, under
stereotactic guidance, a 9 gauge vacuum assisted device was used to
perform core needle biopsy of asymmetry within the inferior right
breast using a lateral approach.

Lesion quadrant: Lower outer quadrant

At the conclusion of the procedure, coil shaped tissue marker clip
was deployed into the biopsy cavity. Follow-up 2-view mammogram was
performed and dictated separately.
IMPRESSION: Stereotactic-guided biopsy of right breast asymmetry. No apparent
complications.

## 2021-05-05 DIAGNOSIS — H2513 Age-related nuclear cataract, bilateral: Secondary | ICD-10-CM | POA: Diagnosis not present

## 2021-05-12 DIAGNOSIS — G4733 Obstructive sleep apnea (adult) (pediatric): Secondary | ICD-10-CM | POA: Diagnosis not present

## 2021-05-12 DIAGNOSIS — B0222 Postherpetic trigeminal neuralgia: Secondary | ICD-10-CM | POA: Diagnosis not present

## 2021-05-12 DIAGNOSIS — E8881 Metabolic syndrome: Secondary | ICD-10-CM | POA: Diagnosis not present

## 2021-05-12 DIAGNOSIS — Z0000 Encounter for general adult medical examination without abnormal findings: Secondary | ICD-10-CM | POA: Diagnosis not present

## 2021-05-12 DIAGNOSIS — F419 Anxiety disorder, unspecified: Secondary | ICD-10-CM | POA: Diagnosis not present

## 2021-05-12 DIAGNOSIS — K279 Peptic ulcer, site unspecified, unspecified as acute or chronic, without hemorrhage or perforation: Secondary | ICD-10-CM | POA: Diagnosis not present

## 2021-05-12 DIAGNOSIS — M25549 Pain in joints of unspecified hand: Secondary | ICD-10-CM | POA: Diagnosis not present

## 2021-05-12 DIAGNOSIS — Z6841 Body Mass Index (BMI) 40.0 and over, adult: Secondary | ICD-10-CM | POA: Diagnosis not present

## 2021-05-12 DIAGNOSIS — I10 Essential (primary) hypertension: Secondary | ICD-10-CM | POA: Diagnosis not present

## 2021-05-14 DIAGNOSIS — C50811 Malignant neoplasm of overlapping sites of right female breast: Secondary | ICD-10-CM | POA: Diagnosis not present

## 2021-05-14 DIAGNOSIS — Z170 Estrogen receptor positive status [ER+]: Secondary | ICD-10-CM | POA: Diagnosis not present

## 2021-05-20 DIAGNOSIS — Z79899 Other long term (current) drug therapy: Secondary | ICD-10-CM | POA: Diagnosis not present

## 2021-07-21 DIAGNOSIS — J40 Bronchitis, not specified as acute or chronic: Secondary | ICD-10-CM | POA: Diagnosis not present

## 2021-07-21 DIAGNOSIS — Z6841 Body Mass Index (BMI) 40.0 and over, adult: Secondary | ICD-10-CM | POA: Diagnosis not present

## 2021-07-21 DIAGNOSIS — J329 Chronic sinusitis, unspecified: Secondary | ICD-10-CM | POA: Diagnosis not present

## 2021-09-30 DIAGNOSIS — R229 Localized swelling, mass and lump, unspecified: Secondary | ICD-10-CM | POA: Diagnosis not present

## 2021-09-30 DIAGNOSIS — Z6841 Body Mass Index (BMI) 40.0 and over, adult: Secondary | ICD-10-CM | POA: Diagnosis not present

## 2021-10-07 DIAGNOSIS — R229 Localized swelling, mass and lump, unspecified: Secondary | ICD-10-CM | POA: Diagnosis not present

## 2021-10-07 DIAGNOSIS — R2241 Localized swelling, mass and lump, right lower limb: Secondary | ICD-10-CM | POA: Diagnosis not present

## 2021-10-30 DIAGNOSIS — M1812 Unilateral primary osteoarthritis of first carpometacarpal joint, left hand: Secondary | ICD-10-CM | POA: Diagnosis not present

## 2021-11-27 DIAGNOSIS — Z170 Estrogen receptor positive status [ER+]: Secondary | ICD-10-CM | POA: Diagnosis not present

## 2021-11-27 DIAGNOSIS — C50511 Malignant neoplasm of lower-outer quadrant of right female breast: Secondary | ICD-10-CM | POA: Diagnosis not present

## 2021-11-27 DIAGNOSIS — C50811 Malignant neoplasm of overlapping sites of right female breast: Secondary | ICD-10-CM | POA: Diagnosis not present

## 2021-12-12 DIAGNOSIS — C50911 Malignant neoplasm of unspecified site of right female breast: Secondary | ICD-10-CM | POA: Diagnosis not present

## 2021-12-18 DIAGNOSIS — E2839 Other primary ovarian failure: Secondary | ICD-10-CM | POA: Diagnosis not present

## 2021-12-18 DIAGNOSIS — Z79811 Long term (current) use of aromatase inhibitors: Secondary | ICD-10-CM | POA: Diagnosis not present

## 2021-12-18 DIAGNOSIS — Z170 Estrogen receptor positive status [ER+]: Secondary | ICD-10-CM | POA: Diagnosis not present

## 2021-12-18 DIAGNOSIS — C50811 Malignant neoplasm of overlapping sites of right female breast: Secondary | ICD-10-CM | POA: Diagnosis not present

## 2021-12-18 DIAGNOSIS — R5383 Other fatigue: Secondary | ICD-10-CM | POA: Diagnosis not present

## 2022-01-14 DIAGNOSIS — Z6841 Body Mass Index (BMI) 40.0 and over, adult: Secondary | ICD-10-CM | POA: Diagnosis not present

## 2022-01-14 DIAGNOSIS — Z23 Encounter for immunization: Secondary | ICD-10-CM | POA: Diagnosis not present

## 2022-01-14 DIAGNOSIS — R739 Hyperglycemia, unspecified: Secondary | ICD-10-CM | POA: Diagnosis not present

## 2022-01-14 DIAGNOSIS — R718 Other abnormality of red blood cells: Secondary | ICD-10-CM | POA: Diagnosis not present

## 2022-01-14 DIAGNOSIS — R251 Tremor, unspecified: Secondary | ICD-10-CM | POA: Diagnosis not present

## 2022-01-15 DIAGNOSIS — M1812 Unilateral primary osteoarthritis of first carpometacarpal joint, left hand: Secondary | ICD-10-CM | POA: Diagnosis not present

## 2022-03-19 DIAGNOSIS — Z780 Asymptomatic menopausal state: Secondary | ICD-10-CM | POA: Diagnosis not present

## 2022-03-19 DIAGNOSIS — E2839 Other primary ovarian failure: Secondary | ICD-10-CM | POA: Diagnosis not present

## 2022-04-07 DIAGNOSIS — Z853 Personal history of malignant neoplasm of breast: Secondary | ICD-10-CM | POA: Diagnosis not present

## 2022-04-07 DIAGNOSIS — R921 Mammographic calcification found on diagnostic imaging of breast: Secondary | ICD-10-CM | POA: Diagnosis not present

## 2022-04-07 DIAGNOSIS — R92323 Mammographic fibroglandular density, bilateral breasts: Secondary | ICD-10-CM | POA: Diagnosis not present

## 2022-05-13 DIAGNOSIS — H2513 Age-related nuclear cataract, bilateral: Secondary | ICD-10-CM | POA: Diagnosis not present

## 2022-05-14 DIAGNOSIS — Z0000 Encounter for general adult medical examination without abnormal findings: Secondary | ICD-10-CM | POA: Diagnosis not present

## 2022-05-14 DIAGNOSIS — Z6841 Body Mass Index (BMI) 40.0 and over, adult: Secondary | ICD-10-CM | POA: Diagnosis not present

## 2022-05-14 DIAGNOSIS — I10 Essential (primary) hypertension: Secondary | ICD-10-CM | POA: Diagnosis not present

## 2022-05-14 DIAGNOSIS — L299 Pruritus, unspecified: Secondary | ICD-10-CM | POA: Diagnosis not present

## 2022-05-14 DIAGNOSIS — B0222 Postherpetic trigeminal neuralgia: Secondary | ICD-10-CM | POA: Diagnosis not present

## 2022-06-18 DIAGNOSIS — C50811 Malignant neoplasm of overlapping sites of right female breast: Secondary | ICD-10-CM | POA: Diagnosis not present

## 2022-06-18 DIAGNOSIS — M8589 Other specified disorders of bone density and structure, multiple sites: Secondary | ICD-10-CM | POA: Diagnosis not present

## 2022-06-18 DIAGNOSIS — Z170 Estrogen receptor positive status [ER+]: Secondary | ICD-10-CM | POA: Diagnosis not present

## 2022-06-18 DIAGNOSIS — Z79811 Long term (current) use of aromatase inhibitors: Secondary | ICD-10-CM | POA: Diagnosis not present

## 2022-06-18 DIAGNOSIS — R5383 Other fatigue: Secondary | ICD-10-CM | POA: Diagnosis not present

## 2022-07-16 DIAGNOSIS — G4733 Obstructive sleep apnea (adult) (pediatric): Secondary | ICD-10-CM | POA: Diagnosis not present

## 2022-09-16 DIAGNOSIS — R5383 Other fatigue: Secondary | ICD-10-CM | POA: Diagnosis not present

## 2022-09-16 DIAGNOSIS — H6122 Impacted cerumen, left ear: Secondary | ICD-10-CM | POA: Diagnosis not present

## 2022-09-16 DIAGNOSIS — R682 Dry mouth, unspecified: Secondary | ICD-10-CM | POA: Diagnosis not present

## 2022-09-16 DIAGNOSIS — J40 Bronchitis, not specified as acute or chronic: Secondary | ICD-10-CM | POA: Diagnosis not present

## 2022-09-16 DIAGNOSIS — R739 Hyperglycemia, unspecified: Secondary | ICD-10-CM | POA: Diagnosis not present

## 2022-09-16 DIAGNOSIS — M2550 Pain in unspecified joint: Secondary | ICD-10-CM | POA: Diagnosis not present

## 2022-09-16 DIAGNOSIS — Z79899 Other long term (current) drug therapy: Secondary | ICD-10-CM | POA: Diagnosis not present

## 2022-09-16 DIAGNOSIS — J329 Chronic sinusitis, unspecified: Secondary | ICD-10-CM | POA: Diagnosis not present

## 2022-09-16 DIAGNOSIS — I10 Essential (primary) hypertension: Secondary | ICD-10-CM | POA: Diagnosis not present

## 2022-09-16 DIAGNOSIS — Z6841 Body Mass Index (BMI) 40.0 and over, adult: Secondary | ICD-10-CM | POA: Diagnosis not present

## 2022-09-16 DIAGNOSIS — K148 Other diseases of tongue: Secondary | ICD-10-CM | POA: Diagnosis not present

## 2022-09-21 DIAGNOSIS — C50911 Malignant neoplasm of unspecified site of right female breast: Secondary | ICD-10-CM | POA: Diagnosis not present

## 2022-09-29 DIAGNOSIS — B0222 Postherpetic trigeminal neuralgia: Secondary | ICD-10-CM | POA: Diagnosis not present

## 2022-10-01 DIAGNOSIS — C50811 Malignant neoplasm of overlapping sites of right female breast: Secondary | ICD-10-CM | POA: Diagnosis not present

## 2022-10-01 DIAGNOSIS — C50511 Malignant neoplasm of lower-outer quadrant of right female breast: Secondary | ICD-10-CM | POA: Diagnosis not present

## 2022-10-01 DIAGNOSIS — Z170 Estrogen receptor positive status [ER+]: Secondary | ICD-10-CM | POA: Diagnosis not present

## 2022-12-22 DIAGNOSIS — Z79811 Long term (current) use of aromatase inhibitors: Secondary | ICD-10-CM | POA: Diagnosis not present

## 2022-12-22 DIAGNOSIS — C50811 Malignant neoplasm of overlapping sites of right female breast: Secondary | ICD-10-CM | POA: Diagnosis not present

## 2022-12-22 DIAGNOSIS — Z170 Estrogen receptor positive status [ER+]: Secondary | ICD-10-CM | POA: Diagnosis not present

## 2022-12-22 DIAGNOSIS — M8589 Other specified disorders of bone density and structure, multiple sites: Secondary | ICD-10-CM | POA: Diagnosis not present

## 2022-12-22 DIAGNOSIS — E2839 Other primary ovarian failure: Secondary | ICD-10-CM | POA: Diagnosis not present

## 2023-01-27 DIAGNOSIS — D225 Melanocytic nevi of trunk: Secondary | ICD-10-CM | POA: Diagnosis not present

## 2023-01-27 DIAGNOSIS — D2239 Melanocytic nevi of other parts of face: Secondary | ICD-10-CM | POA: Diagnosis not present

## 2023-01-27 DIAGNOSIS — L219 Seborrheic dermatitis, unspecified: Secondary | ICD-10-CM | POA: Diagnosis not present

## 2023-01-27 DIAGNOSIS — L299 Pruritus, unspecified: Secondary | ICD-10-CM | POA: Diagnosis not present

## 2023-01-27 DIAGNOSIS — L820 Inflamed seborrheic keratosis: Secondary | ICD-10-CM | POA: Diagnosis not present

## 2023-02-24 DIAGNOSIS — R32 Unspecified urinary incontinence: Secondary | ICD-10-CM | POA: Diagnosis not present

## 2023-02-24 DIAGNOSIS — G4733 Obstructive sleep apnea (adult) (pediatric): Secondary | ICD-10-CM | POA: Diagnosis not present

## 2023-02-24 DIAGNOSIS — M1990 Unspecified osteoarthritis, unspecified site: Secondary | ICD-10-CM | POA: Diagnosis not present

## 2023-02-24 DIAGNOSIS — I739 Peripheral vascular disease, unspecified: Secondary | ICD-10-CM | POA: Diagnosis not present

## 2023-02-24 DIAGNOSIS — B0223 Postherpetic polyneuropathy: Secondary | ICD-10-CM | POA: Diagnosis not present

## 2023-02-24 DIAGNOSIS — I10 Essential (primary) hypertension: Secondary | ICD-10-CM | POA: Diagnosis not present

## 2023-02-24 DIAGNOSIS — K219 Gastro-esophageal reflux disease without esophagitis: Secondary | ICD-10-CM | POA: Diagnosis not present

## 2023-02-24 DIAGNOSIS — F419 Anxiety disorder, unspecified: Secondary | ICD-10-CM | POA: Diagnosis not present

## 2023-03-19 DIAGNOSIS — Z1331 Encounter for screening for depression: Secondary | ICD-10-CM | POA: Diagnosis not present

## 2023-03-19 DIAGNOSIS — R6889 Other general symptoms and signs: Secondary | ICD-10-CM | POA: Diagnosis not present

## 2023-03-19 DIAGNOSIS — Z9181 History of falling: Secondary | ICD-10-CM | POA: Diagnosis not present

## 2023-03-19 DIAGNOSIS — Z6841 Body Mass Index (BMI) 40.0 and over, adult: Secondary | ICD-10-CM | POA: Diagnosis not present

## 2023-03-19 DIAGNOSIS — Z1339 Encounter for screening examination for other mental health and behavioral disorders: Secondary | ICD-10-CM | POA: Diagnosis not present

## 2023-03-22 DIAGNOSIS — M8589 Other specified disorders of bone density and structure, multiple sites: Secondary | ICD-10-CM | POA: Diagnosis not present

## 2023-03-22 DIAGNOSIS — E2839 Other primary ovarian failure: Secondary | ICD-10-CM | POA: Diagnosis not present

## 2023-04-01 DIAGNOSIS — R590 Localized enlarged lymph nodes: Secondary | ICD-10-CM | POA: Diagnosis not present

## 2023-04-15 DIAGNOSIS — C50511 Malignant neoplasm of lower-outer quadrant of right female breast: Secondary | ICD-10-CM | POA: Diagnosis not present

## 2023-04-15 DIAGNOSIS — Z853 Personal history of malignant neoplasm of breast: Secondary | ICD-10-CM | POA: Diagnosis not present

## 2023-04-15 DIAGNOSIS — Z08 Encounter for follow-up examination after completed treatment for malignant neoplasm: Secondary | ICD-10-CM | POA: Diagnosis not present

## 2023-04-15 DIAGNOSIS — R92323 Mammographic fibroglandular density, bilateral breasts: Secondary | ICD-10-CM | POA: Diagnosis not present

## 2023-04-15 DIAGNOSIS — C50311 Malignant neoplasm of lower-inner quadrant of right female breast: Secondary | ICD-10-CM | POA: Diagnosis not present

## 2023-05-12 DIAGNOSIS — G4733 Obstructive sleep apnea (adult) (pediatric): Secondary | ICD-10-CM | POA: Diagnosis not present

## 2023-05-17 DIAGNOSIS — K219 Gastro-esophageal reflux disease without esophagitis: Secondary | ICD-10-CM | POA: Diagnosis not present

## 2023-05-17 DIAGNOSIS — Z23 Encounter for immunization: Secondary | ICD-10-CM | POA: Diagnosis not present

## 2023-05-17 DIAGNOSIS — G4733 Obstructive sleep apnea (adult) (pediatric): Secondary | ICD-10-CM | POA: Diagnosis not present

## 2023-05-17 DIAGNOSIS — M2550 Pain in unspecified joint: Secondary | ICD-10-CM | POA: Diagnosis not present

## 2023-05-17 DIAGNOSIS — F419 Anxiety disorder, unspecified: Secondary | ICD-10-CM | POA: Diagnosis not present

## 2023-05-17 DIAGNOSIS — Z0000 Encounter for general adult medical examination without abnormal findings: Secondary | ICD-10-CM | POA: Diagnosis not present

## 2023-05-17 DIAGNOSIS — Z79899 Other long term (current) drug therapy: Secondary | ICD-10-CM | POA: Diagnosis not present

## 2023-05-17 DIAGNOSIS — I10 Essential (primary) hypertension: Secondary | ICD-10-CM | POA: Diagnosis not present

## 2023-05-17 DIAGNOSIS — Z9181 History of falling: Secondary | ICD-10-CM | POA: Diagnosis not present

## 2023-07-01 DIAGNOSIS — Z170 Estrogen receptor positive status [ER+]: Secondary | ICD-10-CM | POA: Diagnosis not present

## 2023-07-01 DIAGNOSIS — C50811 Malignant neoplasm of overlapping sites of right female breast: Secondary | ICD-10-CM | POA: Diagnosis not present

## 2023-07-01 DIAGNOSIS — Z79811 Long term (current) use of aromatase inhibitors: Secondary | ICD-10-CM | POA: Diagnosis not present

## 2023-07-01 DIAGNOSIS — M8589 Other specified disorders of bone density and structure, multiple sites: Secondary | ICD-10-CM | POA: Diagnosis not present

## 2023-09-30 DIAGNOSIS — Z170 Estrogen receptor positive status [ER+]: Secondary | ICD-10-CM | POA: Diagnosis not present

## 2023-09-30 DIAGNOSIS — C50811 Malignant neoplasm of overlapping sites of right female breast: Secondary | ICD-10-CM | POA: Diagnosis not present

## 2024-01-04 DIAGNOSIS — C50811 Malignant neoplasm of overlapping sites of right female breast: Secondary | ICD-10-CM | POA: Diagnosis not present

## 2024-01-04 DIAGNOSIS — M8589 Other specified disorders of bone density and structure, multiple sites: Secondary | ICD-10-CM | POA: Diagnosis not present

## 2024-01-04 DIAGNOSIS — Z170 Estrogen receptor positive status [ER+]: Secondary | ICD-10-CM | POA: Diagnosis not present

## 2024-01-04 DIAGNOSIS — C50911 Malignant neoplasm of unspecified site of right female breast: Secondary | ICD-10-CM | POA: Diagnosis not present

## 2024-01-26 DIAGNOSIS — D2239 Melanocytic nevi of other parts of face: Secondary | ICD-10-CM | POA: Diagnosis not present

## 2024-01-26 DIAGNOSIS — D225 Melanocytic nevi of trunk: Secondary | ICD-10-CM | POA: Diagnosis not present

## 2024-01-26 DIAGNOSIS — L821 Other seborrheic keratosis: Secondary | ICD-10-CM | POA: Diagnosis not present

## 2024-01-26 DIAGNOSIS — L814 Other melanin hyperpigmentation: Secondary | ICD-10-CM | POA: Diagnosis not present

## 2024-02-14 DIAGNOSIS — Z6841 Body Mass Index (BMI) 40.0 and over, adult: Secondary | ICD-10-CM | POA: Diagnosis not present

## 2024-02-14 DIAGNOSIS — R0789 Other chest pain: Secondary | ICD-10-CM | POA: Diagnosis not present

## 2024-02-14 DIAGNOSIS — Z1331 Encounter for screening for depression: Secondary | ICD-10-CM | POA: Diagnosis not present
